# Patient Record
Sex: Female | Born: 2000 | Race: White | Hispanic: No | Marital: Single
Health system: Southern US, Community
[De-identification: ages and names within clinical notes are randomized; demographics above are authoritative.]

---
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
## 2022-02-25 ENCOUNTER — Ambulatory Visit
Admission: RE | Admit: 2022-02-25 | Discharge: 2022-02-25 | Disposition: A | Discharge status: Home / Self Care | Payer: No Typology Code available for payment source | Source: Ambulatory Visit | Attending: Sports Medicine | Admitting: Sports Medicine

## 2022-02-25 ENCOUNTER — Ambulatory Visit (INDEPENDENT_AMBULATORY_CARE_PROVIDER_SITE_OTHER): Payer: Self-pay | Admitting: Sports Medicine

## 2022-02-25 ENCOUNTER — Other Ambulatory Visit (INDEPENDENT_AMBULATORY_CARE_PROVIDER_SITE_OTHER): Payer: Self-pay

## 2022-02-25 VITALS — BP 102/60 | Ht 66.0 in | Wt 158.0 lb

## 2022-02-25 DIAGNOSIS — Z0189 Encounter for other specified special examinations: Secondary | ICD-10-CM

## 2022-02-25 DIAGNOSIS — Y9315 Activity, underwater diving and snorkeling: Secondary | ICD-10-CM

## 2022-02-25 DIAGNOSIS — Z021 Encounter for pre-employment examination: Secondary | ICD-10-CM | POA: Insufficient documentation

## 2022-02-25 LAB — POCT UA - GLUCOSE/PROTEIN
Glucose, UA: NEGATIVE
Protein, UA: NEGATIVE

## 2022-02-25 LAB — GLUCOSE, POCT (MANUAL RESULT ENTRY): POC Glucose: 97 mg/dl (ref 70–99)

## 2022-02-25 LAB — POCT HEMOGLOBIN: Hemoglobin: 13 g/dL (ref 11–14.6)

## 2022-02-25 NOTE — Progress Notes (Signed)
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 She has no serious medical illnesses or any past history that place her at increased risk for diving. She currently takes no medications.  Complete dive physical evaluation was done and was normal Chest x-ray Urinalysis Hearing and visual screens All were normal  Documents are scanned into the chart

## 2022-02-25 NOTE — Patient Instructions (Addendum)
Go to the [facility] to get your labs done: [address] $27   Go to [place] [facility] and get your Chest x-ray done: [place] inside the [facility] on the 1st Floor [phone]

## 2022-02-25 NOTE — Assessment & Plan Note (Signed)
She was cleared for all diving activity
